# Patient Record
Sex: Female | Born: 1965 | Race: White | Hispanic: No | Marital: Married | Smoking: Never smoker
Health system: Southern US, Community
[De-identification: ages and names within clinical notes are randomized; demographics above are authoritative.]

## PROBLEM LIST (undated history)

## (undated) DIAGNOSIS — K279 Peptic ulcer, site unspecified, unspecified as acute or chronic, without hemorrhage or perforation: Secondary | ICD-10-CM

## (undated) DIAGNOSIS — G4730 Sleep apnea, unspecified: Secondary | ICD-10-CM

## (undated) DIAGNOSIS — G43909 Migraine, unspecified, not intractable, without status migrainosus: Secondary | ICD-10-CM

## (undated) DIAGNOSIS — K2950 Unspecified chronic gastritis without bleeding: Secondary | ICD-10-CM

## (undated) DIAGNOSIS — K589 Irritable bowel syndrome without diarrhea: Secondary | ICD-10-CM

## (undated) DIAGNOSIS — F419 Anxiety disorder, unspecified: Secondary | ICD-10-CM

## (undated) DIAGNOSIS — K648 Other hemorrhoids: Secondary | ICD-10-CM

## (undated) HISTORY — DX: Irritable bowel syndrome, unspecified: K58.9

## (undated) HISTORY — PX: CHOLECYSTECTOMY: SHX55

## (undated) HISTORY — PX: WRIST SURGERY: SHX841

## (undated) HISTORY — DX: Peptic ulcer, site unspecified, unspecified as acute or chronic, without hemorrhage or perforation: K27.9

## (undated) HISTORY — DX: Other hemorrhoids: K64.8

## (undated) HISTORY — PX: BUNIONECTOMY: SHX129

## (undated) HISTORY — DX: Migraine, unspecified, not intractable, without status migrainosus: G43.909

## (undated) HISTORY — DX: Unspecified chronic gastritis without bleeding: K29.50

## (undated) HISTORY — DX: Anxiety disorder, unspecified: F41.9

## (undated) HISTORY — PX: ESOPHAGOGASTRODUODENOSCOPY: SHX1529

## (undated) HISTORY — DX: Sleep apnea, unspecified: G47.30

## (undated) HISTORY — PX: ERCP: SHX60

---

## 2009-06-13 ENCOUNTER — Encounter: Admission: RE | Admit: 2009-06-13 | Discharge: 2009-06-13 | Payer: Self-pay | Admitting: Orthopedic Surgery

## 2009-06-22 ENCOUNTER — Encounter: Admission: RE | Admit: 2009-06-22 | Discharge: 2009-06-22 | Payer: Self-pay | Admitting: Orthopedic Surgery

## 2009-07-06 ENCOUNTER — Ambulatory Visit (HOSPITAL_BASED_OUTPATIENT_CLINIC_OR_DEPARTMENT_OTHER): Admission: RE | Admit: 2009-07-06 | Discharge: 2009-07-06 | Payer: Self-pay | Admitting: Orthopedic Surgery

## 2010-04-17 LAB — POCT HEMOGLOBIN-HEMACUE: Hemoglobin: 12.8 g/dL (ref 12.0–15.0)

## 2011-07-08 IMAGING — RF DG FLUORO GUIDE NDL PLC/BX
2 series · 2 of 2 positions shown · IV contrast (multihance)
Comparison: none

CLINICAL DATA: Wrist pain.

Fluoroscopy Time: 40 seconds
LEFT WRIST INJECTION UNDER FLUOROSCOPY
TECHNIQUE: An appropriate skin entrance site was determined. The
site was marked, prepped with Betadine, draped in the usual sterile
fashion, and infiltrated locally with 1% Lidocaine.  A 25 gauge
skin needle was advanced into the radiocarpal joint under
intermittent fluoroscopy.  A mixture of 0.05 ml Multihance and 10
ml of dilute Bismark 60 was then used to opacify the proximal carpal
joint.  No immediate complication.

[Series 1: (hospital) · 1 of 1 slices shown (1 of 2)]
[im 1/1]
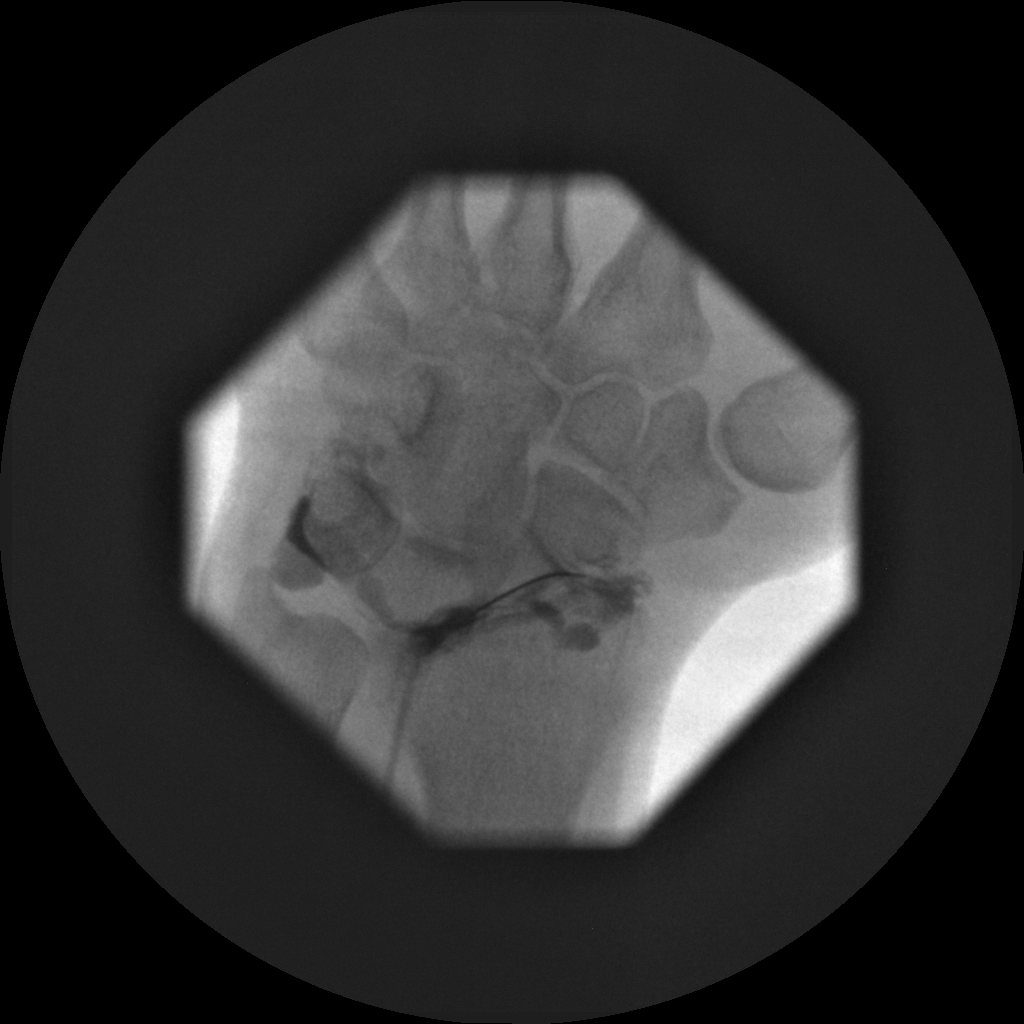

[Series 2: (hospital) · 1 of 1 slices shown (2 of 2)]
[im 1/1]
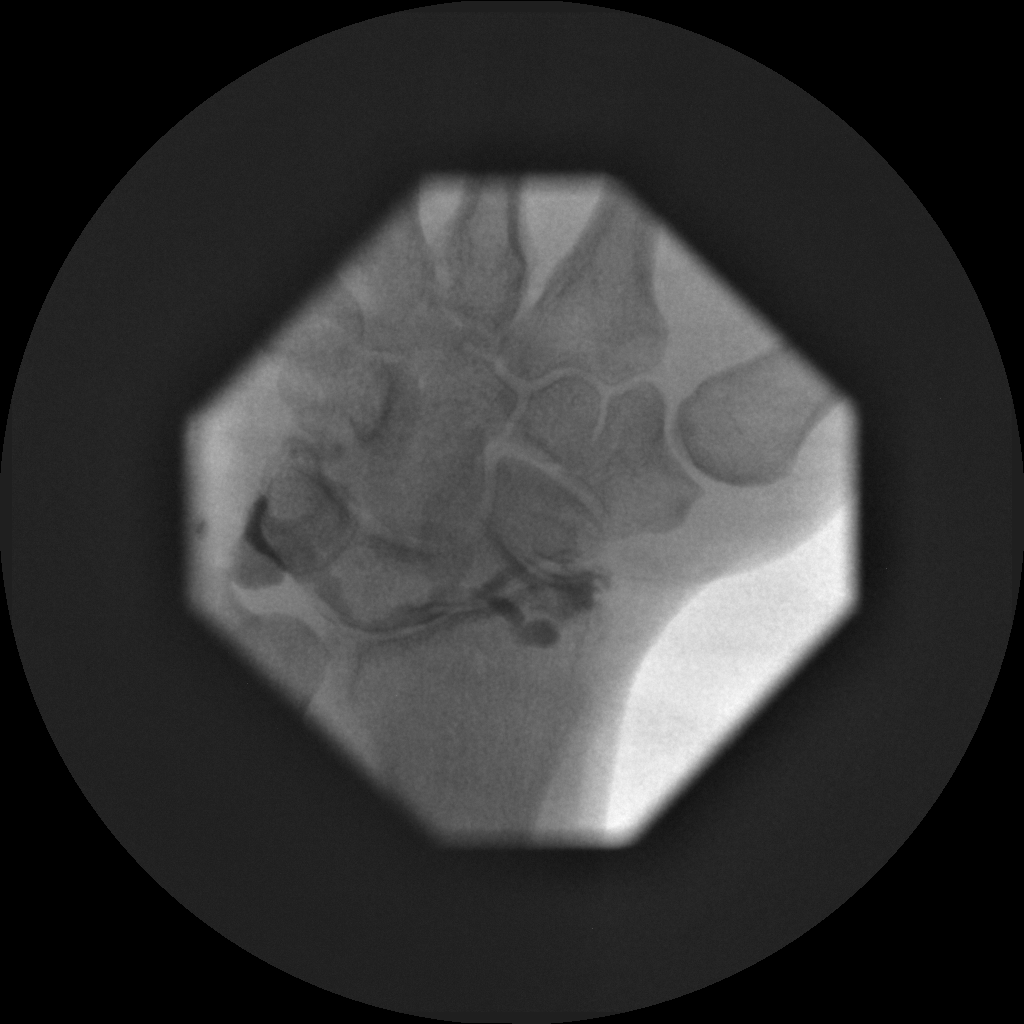

[2 of 2 positions shown; findings below may reference images not displayed]

IMPRESSION: Technically successful left wrist injection for MRI.

## 2014-12-29 ENCOUNTER — Encounter: Payer: Self-pay | Admitting: Internal Medicine

## 2015-03-01 ENCOUNTER — Ambulatory Visit (INDEPENDENT_AMBULATORY_CARE_PROVIDER_SITE_OTHER): Payer: PRIVATE HEALTH INSURANCE | Admitting: Internal Medicine

## 2015-03-01 ENCOUNTER — Encounter: Payer: Self-pay | Admitting: Internal Medicine

## 2015-03-01 VITALS — BP 90/60 | HR 80 | Ht 71.0 in | Wt 165.8 lb

## 2015-03-01 DIAGNOSIS — Z1211 Encounter for screening for malignant neoplasm of colon: Secondary | ICD-10-CM | POA: Diagnosis not present

## 2015-03-01 DIAGNOSIS — R0600 Dyspnea, unspecified: Secondary | ICD-10-CM

## 2015-03-01 DIAGNOSIS — R079 Chest pain, unspecified: Secondary | ICD-10-CM | POA: Diagnosis not present

## 2015-03-01 DIAGNOSIS — K648 Other hemorrhoids: Secondary | ICD-10-CM

## 2015-03-01 NOTE — Patient Instructions (Addendum)
  You have been scheduled for a colonoscopy. Please follow written instructions given to you at your visit today.  Please pick up your over the counter prep supplies at the pharmacy. If you use inhalers (even only as needed), please bring them with you on the day of your procedure. Your physician has requested that you go to www.startemmi.com and enter the access code given to you at your visit today. This web site gives a general overview about your procedure. However, you should still follow specific instructions given to you by our office regarding your preparation for the procedure.   I appreciate the opportunity to care for you. Carl Gessner, MD, FACG  

## 2015-03-01 NOTE — Progress Notes (Addendum)
   Subjective:    Patient ID: Gwendolyn Molina, female    DOB: 02/03/1965, 50 y.o.   MRN: [id number] Chief complaint: Here for colon cancer screening HPI The patient is a very pleasant 50 year old woman from [place] whose gynecologist, Dr. Alonza Smoker, recommended she see me for a colonoscopy. She has a history of what sounds like IBS and a lot of dysphagia symptoms, she had multiple EGDs with biopsies and Elease Hashimoto dilations and is doing well with that at this point. Colonoscopy has been performed though more than 10 years ago.  She is currently dealing with some chest pain issues she's been awakened at night with left pain under her breast heaviness on 2 occasions. She is also having some breathing difficulty. She had an echocardiogram which was apparently okay and is due to have a pharmacologic stress test this Friday 3 days from now. She is also being evaluated for possible sleep apnea because she snores and does not rest well.  She has a long history of what she calls irritated and itchy hemorrhoids. She has slight rectal bleeding on the tissue paper intermittently this is chronic. Has been an issue since the birth of her children. She would like to be considered for hemorrhoidal banding.  She denies significant stress. 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  Medications, allergies, past medical history, past surgical history, family history and social history are reviewed and updated in the EMR.   Review of Systems As above. She has chronic headache problems some fatigue anxiety. All other review of systems are negative.    Objective:   Physical Exam  90/60 mmHg  Pulse 80  Ht  (1.803 m)  Wt 165 lb 12.8 oz (75.206 kg)  BMI 23.13 kg/m2@  General:  Well-developed, well-nourished and in no acute distress Eyes:  anicteric. Lungs: Clear to auscultation bilaterally. Heart:  S1S2, no rubs, murmurs, gallops. Abdomen:  soft,  non-tender, no hepatosplenomegaly, hernia, or mass and BS+.  Rectal: Deferred until colonoscopy Neuro:  A&O x 3.  Psych:  appropriate mood and  Affect.   Data Reviewed:  Numerous records from previous gastroenterologist in [facility] and office notes from Dr. Elder Cyphers. Her last EGD was in 2013. She had reactive gastropathy and no signs of eosinophilic esophagitis in her esophageal biopsies. She's had one endoscopy in 2011 with mild villous atrophy of the duodenum but celiac serologies were negative. He's had an ERCP in the past as well. It was negative, postcholecystectomy.      Assessment & Plan:   1. Colon cancer screening   2. Hemorrhoids with complication   3. Left sided chest pain   4. Dyspnea     Screening colonoscopy is reasonable. Will make sure she is okay from the cardiac standpoint. She will let us know if there is anything going on with her stress test.The risks and benefits as well as alternatives of endoscopic procedure(s) have been discussed and reviewed. All questions answered. The patient agrees to proceed. Can consider hemorrhoidal banding after the colonoscopy is complete. Her chest pain and dyspnea do not sound like they have any link to a GI problem in my estimation. Agree with the workup that is planned.  I appreciate the opportunity to care for this patient. I will send a copy to Dr. Alonza Smoker, [place]

## 2015-03-08 ENCOUNTER — Encounter: Payer: Self-pay | Admitting: Internal Medicine

## 2015-05-02 ENCOUNTER — Encounter: Payer: Self-pay | Admitting: Internal Medicine

## 2015-05-02 ENCOUNTER — Ambulatory Visit (AMBULATORY_SURGERY_CENTER): Payer: PRIVATE HEALTH INSURANCE | Admitting: Internal Medicine

## 2015-05-02 VITALS — BP 85/43 | HR 67 | Temp 99.1°F | Resp 16 | Ht 71.0 in | Wt 165.0 lb

## 2015-05-02 DIAGNOSIS — K648 Other hemorrhoids: Secondary | ICD-10-CM

## 2015-05-02 DIAGNOSIS — Z1211 Encounter for screening for malignant neoplasm of colon: Secondary | ICD-10-CM | POA: Diagnosis not present

## 2015-05-02 HISTORY — DX: Other hemorrhoids: K64.8

## 2015-05-02 MED ORDER — SODIUM CHLORIDE 0.9 % IV SOLN: 500.0000 mL | INTRAVENOUS | Status: DC

## 2015-05-02 NOTE — Patient Instructions (Addendum)
   No polyps or cancer! Next routine colonoscopy in 10 years - 2027  My office will arrange an appointment to start fixing the hemorrhoids.  I appreciate the opportunity to care for you. Iva Booparl E. Lourie Retz, MD, FACG  YOU HAD AN ENDOSCOPIC PROCEDURE TODAY AT THE [facility]:   Refer to the procedure report that was given to you for any specific questions about what was found during the examination.  If the procedure report does not answer your questions, please call your gastroenterologist to clarify.  If you requested that your care partner not be given the details of your procedure findings, then the procedure report has been included in a sealed envelope for you to review at your convenience later.  YOU SHOULD EXPECT: Some feelings of bloating in the abdomen. Passage of more gas than usual.  Walking can help get rid of the air that was put into your GI tract during the procedure and reduce the bloating. If you had a lower endoscopy (such as a colonoscopy or flexible sigmoidoscopy) you may notice spotting of blood in your stool or on the toilet paper. If you underwent a bowel prep for your procedure, you may not have a normal bowel movement for a few days.  Please Note:  You might notice some irritation and congestion in your nose or some drainage.  This is from the oxygen used during your procedure.  There is no need for concern and it should clear up in a day or so.  SYMPTOMS TO REPORT IMMEDIATELY:   Following lower endoscopy (colonoscopy or flexible sigmoidoscopy):  Excessive amounts of blood in the stool  Significant tenderness or worsening of abdominal pains  Swelling of the abdomen that is new, acute  Fever of 100F or higher  For urgent or emergent issues, a gastroenterologist can be reached at any hour by calling [phone].   DIET: Your first meal following the procedure should be a small meal and then it is ok to progress to your normal diet. Heavy or fried  foods are harder to digest and may make you feel nauseous or bloated.  Likewise, meals heavy in dairy and vegetables can increase bloating.  Drink plenty of fluids but you should avoid alcoholic beverages for 24 hours.  ACTIVITY:  You should plan to take it easy for the rest of today and you should NOT DRIVE or use heavy machinery until tomorrow (because of the sedation medicines used during the test).    FOLLOW UP: Our staff will call the number listed on your records the next business day following your procedure to check on you and address any questions or concerns that you may have regarding the information given to you following your procedure. If we do not reach you, we will leave a message.  However, if you are feeling well and you are not experiencing any problems, there is no need to return our call.  We will assume that you have returned to your regular daily activities without incident   SIGNATURES/CONFIDENTIALITY: You and/or your care partner have signed paperwork which will be entered into your electronic medical record.  These signatures attest to the fact that that the information above on your After Visit Summary has been reviewed and is understood.  Full responsibility of the confidentiality of this discharge information lies with you and/or your care-partner.  Please continue your normal medications  Next colonoscopy- 10 years  Please read over information about hemorrhoids

## 2015-05-02 NOTE — Progress Notes (Signed)
Report given to PACU RN, vss 

## 2015-05-02 NOTE — Op Note (Signed)
[facility] Patient Name: Gwendolyn Molina Procedure Date: 05/02/2015 3:20 PM MRN: [id number] Endoscopist: Iva Boop , MD Age: 50 Referring MD:  Date of Birth: 1965/11/11 Gender: Female Procedure:                Colonoscopy Indications:              Screening for colorectal malignant neoplasm Medicines:                Propofol per Anesthesia, Monitored Anesthesia Care Procedure:                Pre-Anesthesia Assessment:                           - Prior to the procedure, a History and Physical                            was performed, and patient medications and                            allergies were reviewed. The patient's tolerance of                            previous anesthesia was also reviewed. The risks                            and benefits of the procedure and the sedation                            options and risks were discussed with the patient.                            All questions were answered, and informed consent                            was obtained. Prior Anticoagulants: The patient has                            taken no previous anticoagulant or antiplatelet                            agents. ASA Grade Assessment: II - A patient with                            mild systemic disease. After reviewing the risks                            and benefits, the patient was deemed in                            satisfactory condition to undergo the procedure.                           After obtaining informed consent, the colonoscope  was passed under direct vision. Throughout the                            procedure, the patient's blood pressure, pulse, and                            oxygen saturations were monitored continuously. The                            Model CF-HQ190L [id number]) scope was introduced                            through the anus and advanced to the the cecum,                            identified  by appendiceal orifice and ileocecal                            valve. The colonoscopy was performed without                            difficulty. The patient tolerated the procedure                            well. The quality of the bowel preparation was                            excellent. The bowel preparation used was Miralax.                            The ileocecal valve, appendiceal orifice, and                            rectum were photographed. Scope In: 3:33:57 PM Scope Out: 3:51:19 PM Scope Withdrawal Time: 0 hours 7 minutes 8 seconds  Total Procedure Duration: 0 hours 17 minutes 22 seconds  Findings:      The perianal and digital rectal examinations were normal.      Internal hemorrhoids were found during retroflexion.      The exam was otherwise without abnormality on direct and retroflexion       views. Complications:            No immediate complications. Estimated Blood Loss:     Estimated blood loss: none. Impression:               - Internal hemorrhoids.                           - The examination was otherwise normal on direct                            and retroflexion views.                           - No specimens collected. Recommendation:           -  Patient has a contact number available for                            emergencies. The signs and symptoms of potential                            delayed complications were discussed with the                            patient. Return to normal activities tomorrow.                            Written discharge instructions were provided to the                            patient.                           - Resume previous diet.                           - Continue present medications.                           - Repeat colonoscopy in 10 years for screening                            purposes.                           - Return to my office at appointment to be                            scheduled.                            - Needs an appointment to start banding internal                            hemorrhoids - my office will contact her and                            arrange. Procedure Code(s):        --- Professional ---                           [id number], Colorectal cancer screening; colonoscopy on                            individual not meeting criteria for high risk CPT copyright 2016 American Medical Association. All rights reserved. Iva Booparl E Renee Erb, MD 05/02/2015 4:01:21 PM This report has been signed electronically. Number of Addenda: 0 CC Letter to:             Valerie A. Cliffton AstersWhite, MD and Alonza Smokerandy Neal, MD (both in  [place], [place]) Referring MD:      Raenette Rover White

## 2015-05-03 ENCOUNTER — Telehealth: Payer: Self-pay

## 2015-05-03 NOTE — Telephone Encounter (Signed)
Left message on answering machine. 

## 2015-06-06 ENCOUNTER — Encounter: Payer: PRIVATE HEALTH INSURANCE | Admitting: Internal Medicine

## 2015-06-16 ENCOUNTER — Encounter: Payer: Self-pay | Admitting: Internal Medicine

## 2015-06-16 ENCOUNTER — Ambulatory Visit (INDEPENDENT_AMBULATORY_CARE_PROVIDER_SITE_OTHER): Payer: PRIVATE HEALTH INSURANCE | Admitting: Internal Medicine

## 2015-06-16 VITALS — BP 110/66 | HR 75 | Ht 71.0 in | Wt 170.0 lb

## 2015-06-16 DIAGNOSIS — K648 Other hemorrhoids: Secondary | ICD-10-CM | POA: Diagnosis not present

## 2015-06-16 NOTE — Assessment & Plan Note (Signed)
All 3 banded RTC prn

## 2015-06-16 NOTE — Patient Instructions (Signed)
  HEMORRHOID BANDING PROCEDURE    FOLLOW-UP CARE   1. The procedure you have had should have been relatively painless since the banding of the area involved does not have nerve endings and there is no pain sensation.  The rubber band cuts off the blood supply to the hemorrhoid and the band may fall off as soon as 48 hours after the banding (the band may occasionally be seen in the toilet bowl following a bowel movement). You may notice a temporary feeling of fullness in the rectum which should respond adequately to plain Tylenol or Motrin.  2. Following the banding, avoid strenuous exercise that evening and resume full activity the next day.  A sitz bath (soaking in a warm tub) or bidet is soothing, and can be useful for cleansing the area after bowel movements.     3. To avoid constipation, take two tablespoons of natural wheat bran, natural oat bran, flax, Benefiber or any over the counter fiber supplement and increase your water intake to 7-8 glasses daily.    4. Unless you have been prescribed anorectal medication, do not put anything inside your rectum for two weeks: No suppositories, enemas, fingers, etc.  5. Occasionally, you may have more bleeding than usual after the banding procedure.  This is often from the untreated hemorrhoids rather than the treated one.  Don't be concerned if there is a tablespoon or so of blood.  If there is more blood than this, lie flat with your bottom higher than your head and apply an ice pack to the area. If the bleeding does not stop within a half an hour or if you feel faint, call our office at [phone] or go to the emergency room.  6. Problems are not common; however, if there is a substantial amount of bleeding, severe pain, chills, fever or difficulty passing urine (very rare) or other problems, you should call us at [phone] or report to the nearest emergency room.  7. Do not stay seated continuously for more than 2-3 hours for a day or  two after the procedure.  Tighten your buttock muscles 10-15 times every two hours and take 10-15 deep breaths every 1-2 hours.  Do not spend more than a few minutes on the toilet if you cannot empty your bowel; instead re-visit the toilet at a later time.    Follow up to see Dr Leone PayorGessner as needed.    I appreciate the opportunity to care for you. Stan Headarl Gessner, MD, [facility] System[facility]ACG

## 2015-06-17 ENCOUNTER — Encounter: Payer: Self-pay | Admitting: Internal Medicine

## 2015-06-17 NOTE — Progress Notes (Signed)
Patient ID: Gwendolyn BrightlyCheryl Uriarte, female   DOB: 1965-08-23, 50 y.o.   MRN: [id number]        Here for treatment of symptomatic hemorrhoids. The patient has symptoms of anal itching, she has had rectal bleeding in the past, she has prolapse symptoms.  Rectal exam revealed medium-sized fleshy tags in all 3 positions no mass and slightly reduced resting tone  Anoscopy was performed with the patient in the left lateral decubitus position while a chaperone was present and revealed grade 2 internal hemorrhoids in all positions.  PROCEDURE NOTE: The patient presents with symptomatic grade 2  hemorrhoids, requesting rubber band ligation of his/her hemorrhoidal disease.  All risks, benefits and alternative forms of therapy were described and informed consent was obtained.   The anorectum was pre-medicated with 0.125% nitroglycerin and 5% lidocaine both topically The decision was made to band all 3 columns of internal hemorrhoids, and the [facility] System was used to perform band ligation without complication.  patient understood that there was increased risk of bleeding and pain with banding more than 1 and chose to accept this. Digital anorectal examination was then performed to assure proper positioning of the band, and to adjust the banded tissue as required.  The patient was discharged home without pain or other issues.  Dietary and behavioral recommendations were given and along with follow-up instructions.       The patient will return as needed for  follow-up and possible additional banding as required. No complications were encountered and the patient tolerated the procedure well.   I appreciate the opportunity to care for this patient. CC: Lenoria ChimeWhite, Valerie A, FNP  Alonza Smokerandy Neal, MD

## 2023-08-09 ENCOUNTER — Ambulatory Visit: Payer: PRIVATE HEALTH INSURANCE | Admitting: Gastroenterology

## 2023-08-09 ENCOUNTER — Encounter: Payer: Self-pay | Admitting: Gastroenterology

## 2023-08-09 ENCOUNTER — Other Ambulatory Visit (INDEPENDENT_AMBULATORY_CARE_PROVIDER_SITE_OTHER): Payer: PRIVATE HEALTH INSURANCE

## 2023-08-09 VITALS — BP 90/64 | HR 100 | Ht 69.5 in | Wt 152.4 lb

## 2023-08-09 DIAGNOSIS — K2970 Gastritis, unspecified, without bleeding: Secondary | ICD-10-CM

## 2023-08-09 DIAGNOSIS — K589 Irritable bowel syndrome without diarrhea: Secondary | ICD-10-CM

## 2023-08-09 DIAGNOSIS — Z8719 Personal history of other diseases of the digestive system: Secondary | ICD-10-CM

## 2023-08-09 DIAGNOSIS — Z8711 Personal history of peptic ulcer disease: Secondary | ICD-10-CM

## 2023-08-09 DIAGNOSIS — R1013 Epigastric pain: Secondary | ICD-10-CM

## 2023-08-09 LAB — CBC WITH DIFFERENTIAL/PLATELET
Basophils Absolute: 0.1 K/uL (ref 0.0–0.1)
Basophils Relative: 1.1 % (ref 0.0–3.0)
Eosinophils Absolute: 0.2 K/uL (ref 0.0–0.7)
Eosinophils Relative: 2.5 % (ref 0.0–5.0)
HCT: 42.7 % (ref 36.0–46.0)
Hemoglobin: 14.3 g/dL (ref 12.0–15.0)
Lymphocytes Relative: 29.5 % (ref 12.0–46.0)
Lymphs Abs: 1.9 K/uL (ref 0.7–4.0)
MCHC: 33.5 g/dL (ref 30.0–36.0)
MCV: 91.3 fl (ref 78.0–100.0)
Monocytes Absolute: 0.5 K/uL (ref 0.1–1.0)
Monocytes Relative: 7 % (ref 3.0–12.0)
Neutro Abs: 3.9 K/uL (ref 1.4–7.7)
Neutrophils Relative %: 59.9 % (ref 43.0–77.0)
Platelets: 218 K/uL (ref 150.0–400.0)
RBC: 4.67 Mil/uL (ref 3.87–5.11)
RDW: 13 % (ref 11.5–15.5)
WBC: 6.6 K/uL (ref 4.0–10.5)

## 2023-08-09 LAB — COMPREHENSIVE METABOLIC PANEL WITH GFR
ALT: 13 U/L (ref 0–35)
AST: 16 U/L (ref 0–37)
Albumin: 4.7 g/dL (ref 3.5–5.2)
Alkaline Phosphatase: 70 U/L (ref 39–117)
BUN: 15 mg/dL (ref 6–23)
CO2: 33 meq/L — ABNORMAL HIGH (ref 19–32)
Calcium: 9.2 mg/dL (ref 8.4–10.5)
Chloride: 102 meq/L (ref 96–112)
Creatinine, Ser: 0.78 mg/dL (ref 0.40–1.20)
GFR: 83.45 mL/min (ref >60.00)
Glucose, Bld: 89 mg/dL (ref 70–99)
Potassium: 4 meq/L (ref 3.5–5.1)
Sodium: 139 meq/L (ref 135–145)
Total Bilirubin: 1.1 mg/dL (ref 0.2–1.2)
Total Protein: 7.2 g/dL (ref 6.0–8.3)

## 2023-08-09 LAB — LIPASE: Lipase: 26 U/L (ref 11.0–59.0)

## 2023-08-09 MED ORDER — SUCRALFATE 1 G PO TABS: 1.0000 g | ORAL_TABLET | Freq: Two times a day (BID) | ORAL | 1 refills | Status: DC

## 2023-08-09 NOTE — Patient Instructions (Addendum)
 Your provider has requested that you go to the basement level for lab work before leaving today. Press B on the elevator. The lab is located at the first door on the left as you exit the elevator.  You have been scheduled for an endoscopy. Please follow written instructions given to you at your visit today.  If you use inhalers (even only as needed), please bring them with you on the day of your procedure.  If you take any of the following medications, they will need to be adjusted prior to your procedure:   DO NOT TAKE 7 DAYS PRIOR TO TEST- Trulicity (dulaglutide) Ozempic, Wegovy (semaglutide) Mounjaro (tirzepatide) Bydureon Bcise (exanatide extended release)  DO NOT TAKE 1 DAY PRIOR TO YOUR TEST Rybelsus (semaglutide) Adlyxin (lixisenatide) Victoza (liraglutide) Byetta (exanatide)  Continue taking Pantoprazole, Carafate   Call the office if any new or worsening symptoms  We have sent the following medications to your pharmacy for you to pick up at your convenience: Carafate   Your provider has ordered Diatherix stool testing for you. You have received a kit from our office today containing all necessary supplies to complete this test. Please carefully read the stool collection instructions provided in the kit before opening the accompanying materials. In addition, be sure there is a label providing your full name and date of birth on the puritan opti-swab tube that is supplied in the kit (if you do not see a label with this information on your test tube, please make us  aware before test collection!). After completing the test, you should secure the purtian tube into the specimen biohazard bag. The [facility] E-Req sheet (including date and time of specimen collection) should be placed into the outside pocket of the specimen biohazard bag and returned to the [facility] lab (basement floor of [facility]) within 3 days of collection. Please make sure to give  the specimen to a staff member at the lab. DO NOT leave the specimen on the counter.   If the specimen date and time (can be found in the upper right boxed portion of the sheet) are not filled out on the E-Req sheet, the test will NOT be performed.     If your blood pressure at your visit was 140/90 or greater, please contact your primary care physician to follow up on this.  _______________________________________________________  If you are age 1 or older, your body mass index should be between 23-30. Your Body mass index is 22.18 kg/m. If this is out of the aforementioned range listed, please consider follow up with your Primary Care Provider.  If you are age 5 or younger, your body mass index should be between 19-25. Your Body mass index is 22.18 kg/m. If this is out of the aformentioned range listed, please consider follow up with your Primary Care Provider.   ________________________________________________________  The [facility] providers would like to encourage you to use MYCHART to communicate with providers for non-urgent requests or questions.  Due to long hold times on the telephone, sending your provider a message by [facility] may be a faster and more efficient way to get a response.  Please allow 48 business hours for a response.  Please remember that this is for non-urgent requests.  _______________________________________________________  Due to recent changes in healthcare laws, you may see the results of your imaging and laboratory studies on MyChart before your provider has had a chance to review them.  We understand that in some cases there may be  results that are confusing or concerning to you. Not all laboratory results come back in the same time frame and the provider may be waiting for multiple results in order to interpret others.  Please give us  48 hours in order for your provider to thoroughly review all the results before contacting the office for clarification of  your results.   Thank you for trusting me with your gastrointestinal care!   Camie Furbish, PA-C

## 2023-08-09 NOTE — Progress Notes (Signed)
 Gwendolyn Molina [id number] 11-29-1965   Chief Complaint: Gastritis  Referring Provider: Teresa Berwyn LABOR, FNP Primary GI MD: Dr. Avram  HPI: Gwendolyn Molina is a 58 y.o. female with past medical history of chronic gastritis, PUD, internal hemorrhoids s/p banding 2017, migraines, prior cholecystectomy, OSA on CPAP who presents today for a complaint of gastritis.    Patient last seen in office by Dr. Avram 03/01/2015 to discuss colon cancer screening.  At that time endorsed history of possible IBS, dysphagia, with history of multiple EGDs and biopsies, and Maloney dilations.  Was doing well at that time.  Also endorsed long history of irritated hemorrhoids with occasional rectal bleeding.  She underwent colonoscopy 05/02/2015 with finding of internal hemorrhoids and otherwise normal with 10-year recall recommended.  Had hemorrhoid banding 06/16/2015 with all 3 columns banded.  Patient is from [place], [place] .  Last visit with PCP 07/12/2023 at which time she endorsed epigastric abdominal pain ongoing for 1 week which required evaluation at the ED.  CT A/P was performed which showed thickening of the gastric body concerning for acute gastritis.  Also had thickening of the small bowel concerning for enteritis.  She is referred to us  for further evaluation.  Her Protonix was increased to 40 mg twice daily and was advised to avoid NSAIDs.  Labs 11/01/2022: Normal CBC with hemoglobin 13.9, normal TSH 2.39   Patient states that June 4 she began having stabbing epigastric pain with radiation to her back.  Tried some things over-the-counter that she often takes for IBS and none of this helped.  She went to the ED with CT findings as previously stated.  States that her labs were normal.  She was started on once daily Protonix and Carafate .  When she saw her PCP her PPI was increased to twice daily dosing.  States that these medications have helped, though she continues to have symptoms.  This week  she ran out of her Protonix and had significant worsening of her epigastric pain with radiation upward into her chest.  States it feels like when she had ulcers in the past. She has adjusted her diet, mostly eating bland foods.  She denies any nausea, vomiting, acid reflux, melena. Denies unintentional weight loss.  She also endorses a history of IBS.  States she used to have a couple flare-ups per year, but this got worse around April/May.  States she has had to leave work on occasion due to symptoms.  Reports she has a high pain tolerance but this has been very severe pain.  IBS symptoms seem to come out of nowhere.  She will have a sudden onset of abdominal cramping and urgent need to have a bowel movement, but when she goes to the bathroom will be unable to have a bowel movement.  She will have a sudden rush of heat over her body, sweating.  Typically will take Gas-X and hyoscyamine.  Eventually will have a blowout bowel movement and then feels relief of her abdominal pain but feels exhausted.  Symptoms do resolve after a bowel movement.  She is unsure if hyoscyamine makes any difference at all.  Denies any clear dietary triggers.  Outside of IBS flares, she has a formed bowel movement daily.  She denies any blood in her stool.  Denies shortness of breath or chest pain.  Reports she had stroke-like symptoms back in 2018, but had thorough evaluation and everything came back negative.  She is not on a blood thinner.  Has not had any  recurrence of these stroke-like symptoms.  Previous GI Procedures/Imaging   CT A/P 07/04/2023 - Thickening of the gastric body likely represents gastritis.  Follow-up endoscopy is suggested to exclude other etiologies. - Thickened loops of small bowel suggestive of enteritis.  Differential is infectious versus inflammatory process.  Colonoscopy 05/02/2015 - Internal hemorrhoids.  - The examination was otherwise normal on direct and retroflexion views.  - No specimens  collected. - Recall 10 years  EGD 05/21/2011 - Esophageal mucosa was normal down to normal Z-line at 40 cm.  No erosive changes, no strictures no ring, no web. - Stomach, pyloric channel, duodenum examined with finding of gastritis which was biopsied Path: - Mild reactive gastropathy, stomach, endoscopic biopsies - No evidence of eosinophilic esophagitis identified  EGD 01/18/2010 - Esophageal mucosa normal down to normal Z-line at 40 cm - Proximal stomach normal - In the antrum there is some gastritis and bilious material refluxing back from the duodenum - Biopsies performed including in the duodenum to look for celiac disease Path: - Moderate chronic inflammation and mild villous atrophy of duodenum, mild chronic gastritis  EGD 09/23/2006 Normal   Past Medical History:  Diagnosis Date   Chronic gastritis    mild   Internal hemorrhoids with complication 05/02/2015   Migraine headache    Peptic ulcer disease     Past Surgical History:  Procedure Laterality Date   BUNIONECTOMY     CHOLECYSTECTOMY     ERCP     ESOPHAGOGASTRODUODENOSCOPY     WRIST SURGERY      Current Outpatient Medications  Medication Sig Dispense Refill   cetirizine (ZYRTEC) 10 MG tablet Take 10 mg by mouth daily.     Oxymetazoline HCl (NASAL SPRAY NA) Place into the nose.     No current facility-administered medications for this visit.    Allergies as of 08/09/2023 - Review Complete 06/17/2015  Allergen Reaction Noted   Penicillins Other (See Comments) 02/17/2015    Family History  Problem Relation Age of Onset   Lung cancer Father    Colon cancer Neg Hx    Prostate cancer Father    Heart disease Maternal Grandmother    Heart disease Paternal Grandmother    Heart disease Paternal Grandfather     Social History   Tobacco Use   Smoking status: Never   Smokeless tobacco: Never  Substance Use Topics   Alcohol use: Yes    Alcohol/week: 0.0 standard drinks of alcohol    Comment: occ    Drug use: No     Review of Systems:    Constitutional: No weight loss, fever, chills, weakness or fatigue Skin: No rash or itching Cardiovascular: No chest pain, chest pressure or palpitations   Respiratory: No SOB or cough Gastrointestinal: See HPI and otherwise negative Genitourinary: No dysuria or change in urinary frequency Neurological: No headache, dizziness or syncope Musculoskeletal: No new muscle or joint pain Hematologic: No bleeding or bruising    Physical Exam:  Vital signs: BP 90/64 (BP Location: Left Arm, Patient Position: Sitting, Cuff Size: Normal)   Pulse 100   Ht 5' 9.5 (1.765 m) Comment: height measured without shoes  Wt 152 lb 6 oz (69.1 kg)   BMI 22.18 kg/m    Constitutional: Pleasant female in NAD, alert and cooperative Head:  Normocephalic and atraumatic.  Eyes: No scleral icterus. Conjunctiva pink. Mouth: No oral lesions. Respiratory: Respirations even and unlabored. Lungs clear to auscultation bilaterally.  No wheezes, crackles, or rhonchi.  Cardiovascular:  Regular rate  and rhythm. No murmurs. No peripheral edema. Gastrointestinal:  Soft, nondistended, nontender. No rebound or guarding. Normal bowel sounds. No appreciable masses or hepatomegaly. Rectal:  Not performed.  Neurologic:  Alert and oriented x4;  grossly normal neurologically.  Skin:   Dry and intact without significant lesions or rashes. Psychiatric: Oriented to person, place and time. Demonstrates good judgement and reason without abnormal affect or behaviors.   Assessment/Plan:   Gastritis History of PUD Epigastric pain Patient with history of gastritis and peptic ulcer disease, recently evaluated at [facility] ED for complaint of epigastric pain with radiation to the back.  On CT found to have thickening of the gastric body likely representing gastritis with follow-up EGD recommended to rule out other etiologies, and thickened loops of small bowel suggestive of enteritis.  She has  been taking Protonix 40 mg twice daily as well as Carafate  1 g twice daily with some but not total improvement in her symptoms.  Recently ran out of Protonix and had significant worsening of epigastric pain with radiation to her back as well as radiation up into her chest.  She does have a refill of her Protonix waiting to be picked up and she will start this again.  - Schedule EGD. I thoroughly discussed the procedure with the patient to include nature of the procedure, alternatives, benefits, and risks (including but not limited to bleeding, infection, perforation, anesthesia/cardiac/pulmonary complications). Patient verbalized understanding and gave verbal consent to proceed with procedure.  - Check labs today: CBC, CMP, lipase - Order [facility] H. pylori - Continue Protonix 40 mg twice daily - Continue Carafate  1 g twice daily  IBS Patient reports history of IBS, typically with flare-ups a couple times a year.  Will have abdominal pain and fecal urgency followed by large blowout bowel movement with subsequent relief of symptoms.  She does take Gas-X and hyoscyamine when symptoms occur though is unsure if these help at all.  Not having any rectal bleeding.  Outside of these flares will have a normal, formed bowel movement daily.  Last colonoscopy in 2017 was normal aside from internal hemorrhoids with recall recommended in 10 years.  - Monitor symptoms.  If worsening symptoms of IBS or development of rectal bleeding consider early repeat colonoscopy.   Camie Furbish, PA-C [facility] 08/09/2023, 1:32 PM  Patient Care Team: Teresa Berwyn LABOR, FNP as PCP - General (Nurse Practitioner)    Primary gastroenterologist:  Agree with plans to evaluate abnormal stomach on CT plus epigastric pain requiring twice daily PPI with EGD.  Her labs were normal.  If she does not do the [facility] H. pylori test we can check for H. pylori with biopsies.  Lupita CHARLENA Commander, MD, NOLIA

## 2023-08-12 ENCOUNTER — Ambulatory Visit: Payer: Self-pay | Admitting: Gastroenterology

## 2023-08-19 ENCOUNTER — Telehealth: Payer: Self-pay | Admitting: Gastroenterology

## 2023-08-19 NOTE — Telephone Encounter (Signed)
 Pt notified through mychart.

## 2023-08-19 NOTE — Telephone Encounter (Signed)
 Patient is calling to find out how much longer she is having to wait for her results to come back. Patient is very upset and requesting a call back. Please advise.

## 2023-08-19 NOTE — Telephone Encounter (Signed)
 [facility] H. Pylori results received. Negative for H. Pylori 08/10/2023.

## 2023-08-19 NOTE — Telephone Encounter (Signed)
 Camie have you seen this result?

## 2023-08-20 NOTE — Telephone Encounter (Signed)
 Called and spoke with patient. She reports that she is not having the pain today in her shoulder blade. Patient states that she has been to the ER for this pain in the past, and cardiac related issues were ruled out. Patient reports that she is having a constant sternal pain that can radiate to one or both shoulder blades. She is taking her Carafate  and Protonix as prescribed. Patient would like to know if there is anything else that provider recommends.

## 2023-08-20 NOTE — Telephone Encounter (Signed)
 I called to see if I could clarify but I do not think I learned anything more other than that she is much better as Daphne pointed out.  This does not sound GI or cardiac in origin to me.  It could be musculoskeletal.   She will monitor it and we will see her for EGD as planned unless something comes up.  She reports she was in the emergency [place] on June 5 and that is where they ruled out MI.  That was also the date of the CT scan.  Camie did you have all of those records?  I do see the CT scan report is scanned in

## 2023-08-29 ENCOUNTER — Encounter: Payer: Self-pay | Admitting: Internal Medicine

## 2023-09-04 ENCOUNTER — Encounter: Payer: Self-pay | Admitting: Internal Medicine

## 2023-09-04 ENCOUNTER — Ambulatory Visit (AMBULATORY_SURGERY_CENTER): Admitting: Internal Medicine

## 2023-09-04 VITALS — BP 121/65 | HR 61 | Temp 98.0°F | Resp 16 | Ht 69.0 in | Wt 152.0 lb

## 2023-09-04 DIAGNOSIS — R933 Abnormal findings on diagnostic imaging of other parts of digestive tract: Secondary | ICD-10-CM

## 2023-09-04 DIAGNOSIS — R1013 Epigastric pain: Secondary | ICD-10-CM

## 2023-09-04 DIAGNOSIS — K3189 Other diseases of stomach and duodenum: Secondary | ICD-10-CM | POA: Diagnosis present

## 2023-09-04 DIAGNOSIS — Z8711 Personal history of peptic ulcer disease: Secondary | ICD-10-CM

## 2023-09-04 MED ORDER — HYOSCYAMINE SULFATE 0.125 MG SL SUBL: 0.1250 mg | SUBLINGUAL_TABLET | SUBLINGUAL | 1 refills | Status: AC | PRN

## 2023-09-04 MED ORDER — SODIUM CHLORIDE 0.9 % IV SOLN: 500.0000 mL | Freq: Once | INTRAVENOUS | Status: DC

## 2023-09-04 NOTE — Progress Notes (Signed)
 Report to PACU, RN, vss, BBS= Clear.

## 2023-09-04 NOTE — Progress Notes (Signed)
 Vitals-DT  Pt's states no medical or surgical changes since previsit or office visit.

## 2023-09-04 NOTE — Patient Instructions (Addendum)
 I did not find any ulcers or significant abnormality of your esophagus, stomach or upper intestine.  I am going to have my office order a special CT scan called a CT enterography to evaluate you further.  I appreciate the opportunity to care for you. Lupita CHARLENA Commander, MD, FACG   YOU HAD AN ENDOSCOPIC PROCEDURE TODAY AT THE [facility]:   Refer to the procedure report that was given to you for any specific questions about what was found during the examination.  If the procedure report does not answer your questions, please call your gastroenterologist to clarify.  If you requested that your care partner not be given the details of your procedure findings, then the procedure report has been included in a sealed envelope for you to review at your convenience later.  YOU SHOULD EXPECT: Some feelings of bloating in the abdomen. Passage of more gas than usual.  Walking can help get rid of the air that was put into your GI tract during the procedure and reduce the bloating. If you had a lower endoscopy (such as a colonoscopy or flexible sigmoidoscopy) you may notice spotting of blood in your stool or on the toilet paper. If you underwent a bowel prep for your procedure, you may not have a normal bowel movement for a few days.  Please Note:  You might notice some irritation and congestion in your nose or some drainage.  This is from the oxygen used during your procedure.  There is no need for concern and it should clear up in a day or so.  SYMPTOMS TO REPORT IMMEDIATELY:  Following upper endoscopy (EGD)  Vomiting of blood or coffee ground material  New chest pain or pain under the shoulder blades  Painful or persistently difficult swallowing  New shortness of breath  Fever of 100F or higher  Black, tarry-looking stools  For urgent or emergent issues, a gastroenterologist can be reached at any hour by calling [phone]. Do not use MyChart messaging for urgent concerns.    DIET:   We do recommend a small meal at first, but then you may proceed to your regular diet.  Drink plenty of fluids but you should avoid alcoholic beverages for 24 hours.  ACTIVITY:  You should plan to take it easy for the rest of today and you should NOT DRIVE or use heavy machinery until tomorrow (because of the sedation medicines used during the test).    FOLLOW UP: Our staff will call the number listed on your records the next business day following your procedure.  We will call around 7:15- 8:00 am to check on you and address any questions or concerns that you may have regarding the information given to you following your procedure. If we do not reach you, we will leave a message.     If any biopsies were taken you will be contacted by phone or by letter within the next 1-3 weeks.  Please call us  at [phone] if you have not heard about the biopsies in 3 weeks.    SIGNATURES/CONFIDENTIALITY: You and/or your care partner have signed paperwork which will be entered into your electronic medical record.  These signatures attest to the fact that that the information above on your After Visit Summary has been reviewed and is understood.  Full responsibility of the confidentiality of this discharge information lies with you and/or your care-partner.

## 2023-09-04 NOTE — Op Note (Signed)
 [facility] Patient Name: Gwendolyn Molina Procedure Date: 09/04/2023 1:50 PM MRN: [id number] Endoscopist: Lupita FORBES Commander , MD, [id number] Age: 58 Referring MD:  Date of Birth: 10-14-1965 Gender: Female Account #: [id number] Procedure:                Upper GI endoscopy Indications:              Epigastric abdominal pain, Abnormal CT of the GI                            tract thickened gastric wall Medicines:                Monitored Anesthesia Care Procedure:                Pre-Anesthesia Assessment:                           - Prior to the procedure, a History and Physical                            was performed, and patient medications and                            allergies were reviewed. The patient's tolerance of                            previous anesthesia was also reviewed. The risks                            and benefits of the procedure and the sedation                            options and risks were discussed with the patient.                            All questions were answered, and informed consent                            was obtained. Prior Anticoagulants: The patient has                            taken no anticoagulant or antiplatelet agents. ASA                            Grade Assessment: II - A patient with mild systemic                            disease. After reviewing the risks and benefits,                            the patient was deemed in satisfactory condition to                            undergo the procedure.  After obtaining informed consent, the endoscope was                            passed under direct vision. Throughout the                            procedure, the patient's blood pressure, pulse, and                            oxygen saturations were monitored continuously. The                            GIF [id number] #[id number] was introduced through the                            mouth, and advanced to  the second part of duodenum.                            The upper GI endoscopy was accomplished without                            difficulty. The patient tolerated the procedure                            well. Scope In: Scope Out: Findings:                 Patchy mildly erythematous mucosa without bleeding                            was found in the gastric antrum.                           The exam was otherwise without abnormality.                           The cardia and gastric fundus were normal on                            retroflexion. Complications:            No immediate complications. Estimated Blood Loss:     Estimated blood loss: none. Impression:               - Erythematous mucosa in the antrum. She has a                            history of gastritis, recent H. pylori stool                            testing was negative biopsies not necessary. I do                            not think this is related to her problems and does  not represent a significant health issue.                           - The examination was otherwise normal.                           - No specimens collected.                           She has problems with other abdominal pain as well                            with some abdominal distention and sweats and then                            diarrhea. Long history of IBS but she says this is                            much more severe and was what she presented to the                            hospital in [place] for when they found the                            abnormal stomach on CT. She also had some thickened                            loops of small bowel at the time. When she gets her                            attacks she will take multiple hyoscyamine  with                            possible delayed benefit but no rapid relief. Recommendation:           - Patient has a contact number available for                             emergencies. The signs and symptoms of potential                            delayed complications were discussed with the                            patient. Return to normal activities tomorrow.                            Written discharge instructions were provided to the                            patient.                           - Resume previous  diet.                           - Continue present medications.                           - Office will order CT enterography to evaluate                            abnormal small bowel on CT scan, abdominal pain,                            diarrhea Lupita FORBES Commander, MD 09/04/2023 2:22:51 PM This report has been signed electronically.

## 2023-09-04 NOTE — Progress Notes (Signed)
 History and Physical Interval Note:  09/04/2023 2:05 PM  Gwendolyn Molina  has presented today for endoscopic procedure(s), with the diagnosis of  Encounter Diagnoses  Name Primary?   Abnormal CT scan, stomach Yes   History of peptic ulcer disease    Abdominal pain, epigastric   .  The various methods of evaluation and treatment have been discussed with the patient and/or family. After consideration of risks, benefits and other options for treatment, the patient has consented to  the endoscopic procedure(s).   The patient's history has been reviewed, patient examined, no change in status, stable for endoscopic procedure(s).  I have reviewed the patient's chart and labs.  Questions were answered to the patient's satisfaction.     [name], MD, NOLIA

## 2023-09-05 ENCOUNTER — Telehealth: Payer: Self-pay

## 2023-09-05 ENCOUNTER — Telehealth: Payer: Self-pay | Admitting: *Deleted

## 2023-09-05 DIAGNOSIS — R933 Abnormal findings on diagnostic imaging of other parts of digestive tract: Secondary | ICD-10-CM

## 2023-09-05 DIAGNOSIS — R197 Diarrhea, unspecified: Secondary | ICD-10-CM

## 2023-09-05 DIAGNOSIS — R1013 Epigastric pain: Secondary | ICD-10-CM

## 2023-09-05 NOTE — Telephone Encounter (Signed)
  Follow up Call-     09/04/2023    1:17 PM 09/04/2023    1:13 PM  Call back number  Post procedure Call Back phone  # [phone]   Permission to leave phone message  Yes     Patient questions:  Do you have a fever, pain , or abdominal swelling? No. Pain Score  0 *  Have you tolerated food without any problems? Yes.    Have you been able to return to your normal activities? Yes.    Do you have any questions about your discharge instructions: Diet   No. Medications  No. Follow up visit  No.  Do you have questions or concerns about your Care? No.  Actions: * If pain score is 4 or above: No action needed, pain <4.

## 2023-09-05 NOTE — Telephone Encounter (Signed)
 Per Dr Avram s/p Gwendolyn Molina's EGD done 09/04/2023 he ordered a CT enterography to evaluate abnormal small bowel on CT scan, abdominal pain, diarrhea.   Patient aware of appointment : 09/12/2023 at [facility], arrive at 12:45pm for a 2:00pm scan. Liquids only 4 hours prior. She was given phone # [phone] if she needs to r/s.

## 2023-09-11 ENCOUNTER — Encounter: Payer: PRIVATE HEALTH INSURANCE | Admitting: Internal Medicine

## 2023-09-12 ENCOUNTER — Ambulatory Visit (HOSPITAL_COMMUNITY)
Admission: RE | Admit: 2023-09-12 | Discharge: 2023-09-12 | Disposition: A | Discharge status: Home / Self Care | Source: Ambulatory Visit | Attending: Internal Medicine | Admitting: Internal Medicine

## 2023-09-12 DIAGNOSIS — R197 Diarrhea, unspecified: Secondary | ICD-10-CM | POA: Insufficient documentation

## 2023-09-12 DIAGNOSIS — R933 Abnormal findings on diagnostic imaging of other parts of digestive tract: Secondary | ICD-10-CM | POA: Diagnosis present

## 2023-09-12 DIAGNOSIS — R1013 Epigastric pain: Secondary | ICD-10-CM | POA: Insufficient documentation

## 2023-09-12 MED ORDER — SODIUM CHLORIDE (PF) 0.9 % IJ SOLN
INTRAMUSCULAR | Status: AC
  Filled 2023-09-12: qty 50

## 2023-09-12 MED ORDER — IOHEXOL 300 MG/ML  SOLN
100.0000 mL | Freq: Once | INTRAMUSCULAR | Status: AC | PRN
  Administered 2023-09-12: 100 mL via INTRAVENOUS

## 2023-09-16 ENCOUNTER — Ambulatory Visit: Payer: Self-pay | Admitting: Internal Medicine

## 2023-10-04 ENCOUNTER — Other Ambulatory Visit: Payer: Self-pay | Admitting: Gastroenterology

## 2023-10-11 ENCOUNTER — Ambulatory Visit: Payer: PRIVATE HEALTH INSURANCE | Admitting: Gastroenterology

## 2023-10-24 ENCOUNTER — Ambulatory Visit: Payer: PRIVATE HEALTH INSURANCE | Admitting: Gastroenterology

## 2023-11-05 ENCOUNTER — Telehealth: Payer: Self-pay | Admitting: Internal Medicine

## 2023-11-05 DIAGNOSIS — K589 Irritable bowel syndrome without diarrhea: Secondary | ICD-10-CM

## 2023-11-05 DIAGNOSIS — R197 Diarrhea, unspecified: Secondary | ICD-10-CM

## 2023-11-05 NOTE — Telephone Encounter (Signed)
 Spoke with pt. She reports that she had cancelled her appt in September because she was feeling better. She reports on Sunday she had another attack. She states her abdomen feels like it was a punching bag. Pt has Hyoscyamine  which she took, but reports it doesn't seem to help. Pt would like to know what her next steps should be. She also wants to know if there is something that will prevent her from having the IBS symptoms. Will route to provider to review and advise.

## 2023-11-05 NOTE — Telephone Encounter (Signed)
 Inbound call from patient stating that her IBS symptoms have now returned and is requesting a call back from the nurse to discuss and to see what the next steps are. Please advise.

## 2023-11-06 MED ORDER — NA SULFATE-K SULFATE-MG SULF 17.5-3.13-1.6 GM/177ML PO SOLN: 1.0000 | Freq: Once | ORAL | 0 refills | Status: AC

## 2023-11-06 NOTE — Telephone Encounter (Signed)
 Called and spoke with pt. Discussed recommendations from provider to schedule a colonoscopy. Pt in agreement to schedule appt. Appt scheduled for 11/28/2023 at 0930. Pharmacy verified. Will send prep to [facility] in [place]. Instructions discussed with pt and will be sent via my chart.

## 2023-11-28 ENCOUNTER — Encounter: Admitting: Internal Medicine
